# Patient Record
Sex: Male | Born: 1950 | Race: White | Hispanic: No | Marital: Married
Health system: Southern US, Community
[De-identification: ages and names within clinical notes are randomized; demographics above are authoritative.]

---

## 2007-02-25 ENCOUNTER — Ambulatory Visit: Payer: Self-pay | Admitting: Specialist

## 2007-03-09 ENCOUNTER — Ambulatory Visit: Payer: Self-pay | Admitting: Internal Medicine

## 2007-03-23 ENCOUNTER — Ambulatory Visit (HOSPITAL_COMMUNITY): Admission: RE | Admit: 2007-03-23 | Discharge: 2007-03-23 | Payer: Self-pay | Admitting: Neurosurgery

## 2008-03-13 ENCOUNTER — Encounter: Admission: RE | Admit: 2008-03-13 | Discharge: 2008-03-13 | Payer: Self-pay | Admitting: Neurosurgery

## 2009-11-15 ENCOUNTER — Emergency Department: Payer: Self-pay | Admitting: Emergency Medicine

## 2009-11-27 ENCOUNTER — Ambulatory Visit: Payer: Self-pay | Admitting: Internal Medicine

## 2010-05-26 NOTE — Op Note (Signed)
NAME:  Blake Stein, Blake Stein NO.:  [id number]   MEDICAL RECORD NO.:  [id number]          PATIENT TYPE:  OIB   LOCATION:  3599                         FACILITY:  [facility]   PHYSICIAN:  Reinaldo Meeker, M.D. 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   PROCEDURE:  C6-7 anterior cervical diskectomy with bone bank fusion  followed by Mystique anterior cervical plating.   SURGEON:  Reinaldo Meeker, M.D.   PROCEDURE IN DETAIL:  After placement in the supine position in 5 pounds  halter traction the patient's neck was prepped and draped in the usual  sterile fashion.  Localizing x-rays were taken prior to incision to  identify the appropriate level.  Transverse incision was made in the  right anterior neck starting at the midline and headed towards the  medial aspect of the sternocleidomastoid muscle.  The platysma was  incised transversely.  The natural fascial plane between the strap  muscles medially and the sternocleidomastoid laterally was identified  and followed down to the anterior aspect of the cervical spine.  The  longus coli muscles were identified, split in the midline, stripped away  bilaterally with a Science writer.  A self retaining  retractor was placed for exposure and x-rays showed approach to the  appropriate level.  Using the 15 blade the disk at C6-7 was incised.  Using pituitary rongeurs and curettes approximately 90% of the disk  material was removed.  High speed drill was used to widen the  interspace.  The microscope was draped and brought in the field and used  for the remainder of the case.  Using microsection technique the  remainder of the posterior longitudinal ligament was removed.  The  ligament was then incised transversely and the  cut edge was removed with  a Kerrison punch.  Thorough decompression was then carried out.  Inspection towards the right C6-7 foramen yielded a large amount of disk  material with marked compression on the nerve root.  This was removed in  a piecemeal fashion until the nerve root was well visualized and  decompressed.  At this time inspection was carried out in all directions  for any evidence of residual compression and none could be identified.  Large amounts of irrigation were carried out.  Measurements were taken  and an 8 mm bone bank plug was reconstituted.  After irrigating once  more and confirming hemostasis the plug was impacted without difficulty  and fluoroscopy showed it to be in good position.  An appropriate length  Mystique anterior cervical plate was then chosen.  Under fluoroscopic  guidance drill holes were placed followed by tapping and retapping and  placement of 13 mm screws x4.  Final fluoroscopy showed the plate and  screws and plug to all be in good position.  Large amounts of irrigation  were carried out.  Any bleeding controlled with  bipolar coagulation.  The wound was then closed with interrupted Vicryl on the platysma,  inverted 5-0 PDS on the subcuticular layer and Steri-Strips on the skin.  A sterile dressing was then applied on a soft collar.  The patient was  extubated and taken to the recovery room in stable condition.          ______________________________  Reinaldo Meeker, M.D.    ROK/MEDQ  D:  03/23/2007  T:  03/24/2007  Job:  [id number]

## 2010-10-05 LAB — CBC
Platelets: 224
WBC: 5.6

## 2012-03-09 ENCOUNTER — Ambulatory Visit: Payer: Self-pay | Admitting: Physician Assistant

## 2012-03-09 ENCOUNTER — Inpatient Hospital Stay: Payer: Self-pay | Admitting: Surgery

## 2012-03-10 LAB — CBC WITH DIFFERENTIAL/PLATELET
Basophil #: 0.1 10*3/uL (ref 0.0–0.1)
Basophil %: 0.4 %
MCH: 31.7 pg (ref 26.0–34.0)
MCHC: 33.9 g/dL (ref 32.0–36.0)
Monocyte #: 1.2 x10 3/mm — ABNORMAL HIGH (ref 0.2–1.0)
Neutrophil %: 79 %
RDW: 13.3 % (ref 11.5–14.5)

## 2012-03-10 LAB — URINALYSIS, COMPLETE
Bacteria: NONE SEEN
Glucose,UR: NEGATIVE mg/dL (ref 0–75)
Leukocyte Esterase: NEGATIVE
Ph: 8 (ref 4.5–8.0)
Specific Gravity: 1.011 (ref 1.003–1.030)

## 2012-03-10 LAB — COMPREHENSIVE METABOLIC PANEL
Bilirubin,Total: 1.2 mg/dL — ABNORMAL HIGH (ref 0.2–1.0)
Calcium, Total: 8.7 mg/dL (ref 8.5–10.1)
Chloride: 105 mmol/L (ref 98–107)
EGFR (African American): >60
Glucose: 88 mg/dL (ref 65–99)
SGOT(AST): 17 U/L (ref 15–37)
SGPT (ALT): 36 U/L (ref 12–78)
Total Protein: 6.8 g/dL (ref 6.4–8.2)

## 2012-03-10 LAB — LIPID PANEL
Ldl Cholesterol, Calc: 108 mg/dL — ABNORMAL HIGH (ref 0–100)
Triglycerides: 85 mg/dL (ref 0–200)
VLDL Cholesterol, Calc: 17 mg/dL (ref 5–40)

## 2012-03-11 LAB — CBC WITH DIFFERENTIAL/PLATELET
Basophil #: 0 10*3/uL (ref 0.0–0.1)
HGB: 13.4 g/dL (ref 13.0–18.0)
Lymphocyte #: 0.8 10*3/uL — ABNORMAL LOW (ref 1.0–3.6)
Lymphocyte %: 6.6 %
MCH: 32 pg (ref 26.0–34.0)
Neutrophil #: 11.1 10*3/uL — ABNORMAL HIGH (ref 1.4–6.5)
Platelet: 150 10*3/uL (ref 150–440)
RBC: 4.2 10*6/uL — ABNORMAL LOW (ref 4.40–5.90)

## 2012-03-11 LAB — LIPASE, BLOOD: Lipase: 152 U/L (ref 73–393)

## 2012-03-11 LAB — BASIC METABOLIC PANEL
Anion Gap: 5 — ABNORMAL LOW (ref 7–16)
BUN: 11 mg/dL (ref 7–18)
Calcium, Total: 8.7 mg/dL (ref 8.5–10.1)
Co2: 29 mmol/L (ref 21–32)
Creatinine: 0.95 mg/dL (ref 0.60–1.30)
EGFR (African American): >60
Glucose: 115 mg/dL — ABNORMAL HIGH (ref 65–99)
Sodium: 143 mmol/L (ref 136–145)

## 2012-03-14 LAB — PATHOLOGY REPORT

## 2013-03-29 IMAGING — CT CT ABD-PELV W/ CM
1 of 2 series · 14 of 32 positions shown, 18 images · non-contrast
Comparison: none

REASON FOR EXAM: CALL REPORT [id number]  ABD PAIN ELEVATD LIPASE DIABETIC
NOT Metformin
COMMENTS:

[Series 2: soft tissue · axial · 0.70mm/px · z∈[-516,-69]mm · 14 of 163 slices shown, 18 images]
[im 7/163  soft-tissue]
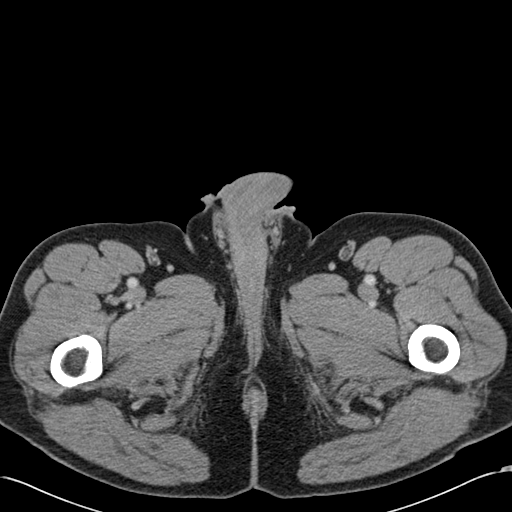
[im 7/163  bone]
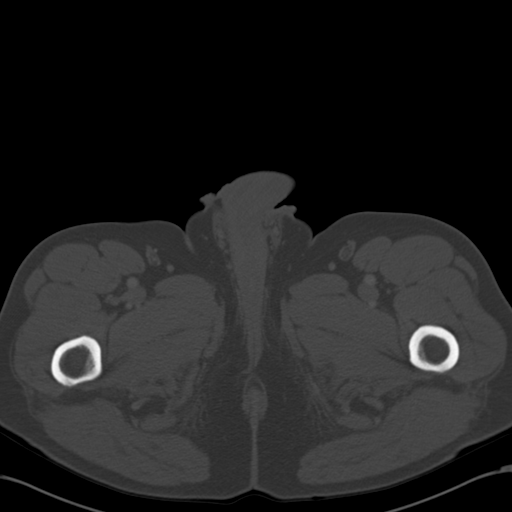
[im 21/163  soft-tissue]
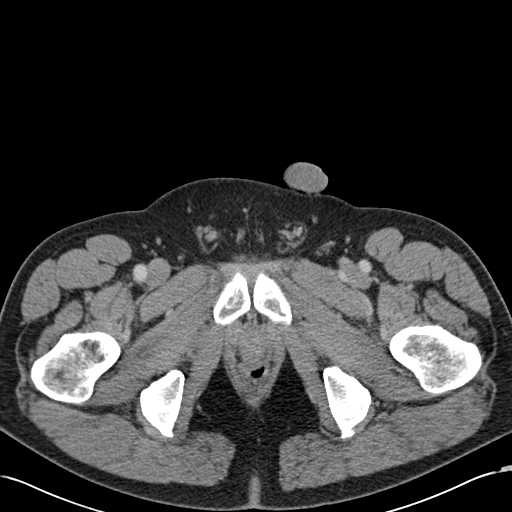
[im 34/163  soft-tissue]
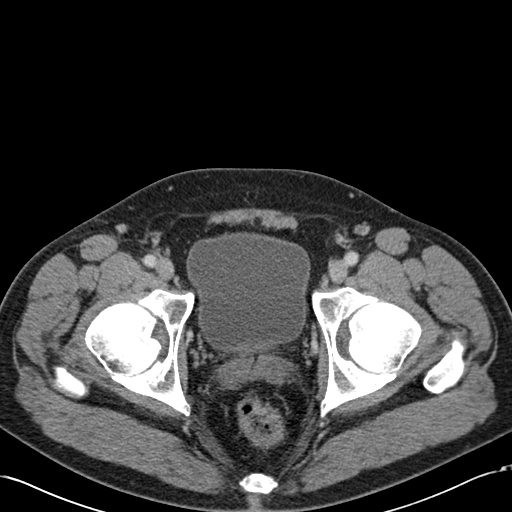
[im 48/163  soft-tissue]
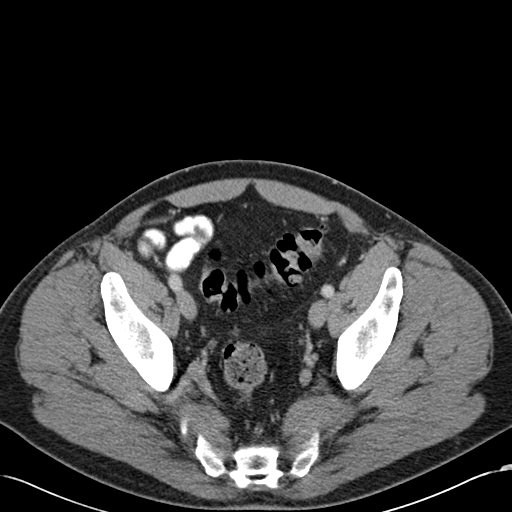
[im 61/163  soft-tissue]
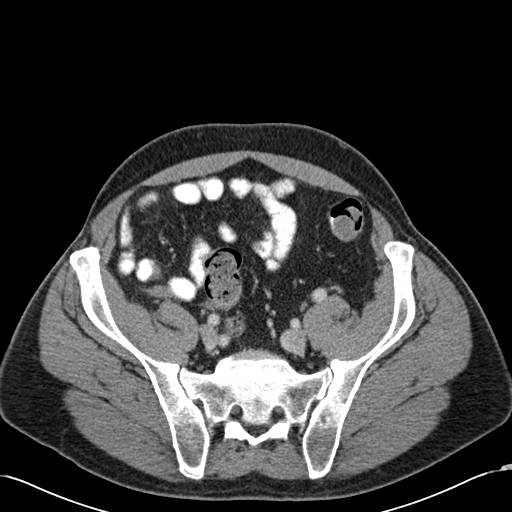
[im 75/163  soft-tissue]
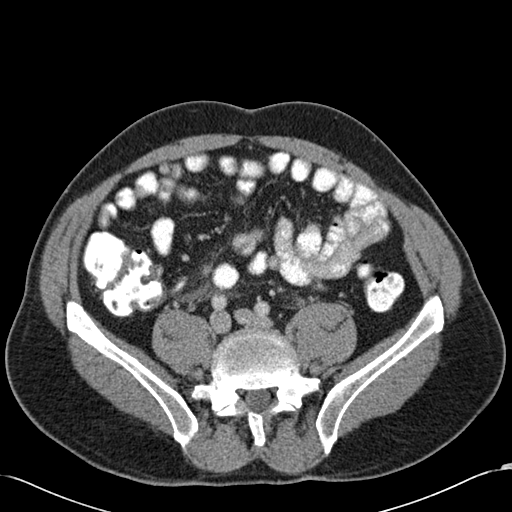
[im 88/163  soft-tissue]
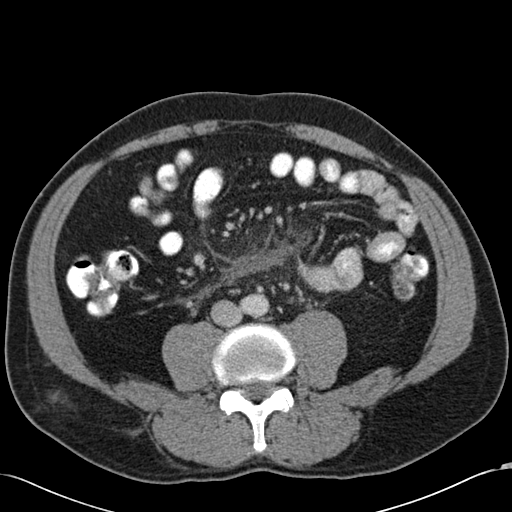
[im 102/163  soft-tissue]
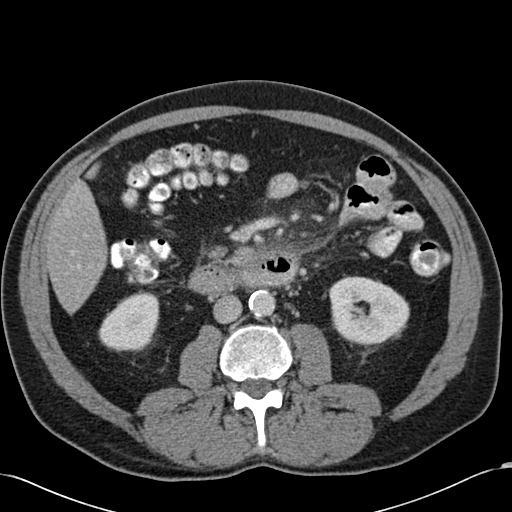
[im 115/163  soft-tissue]
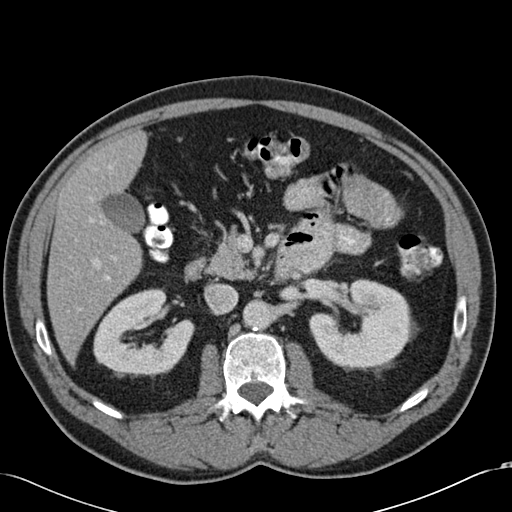
[im 115/163  bone]
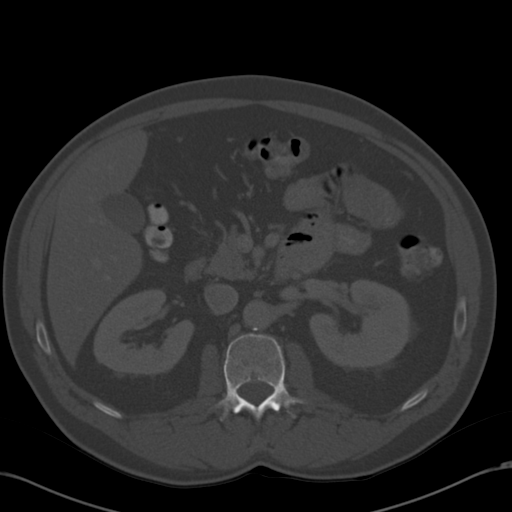
[im 129/163  soft-tissue]
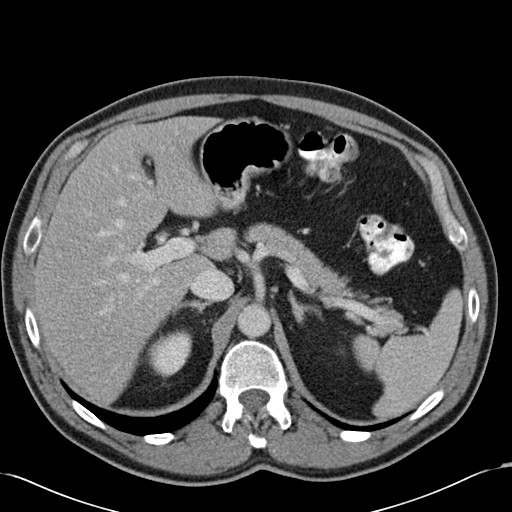
[im 136/163  lung]
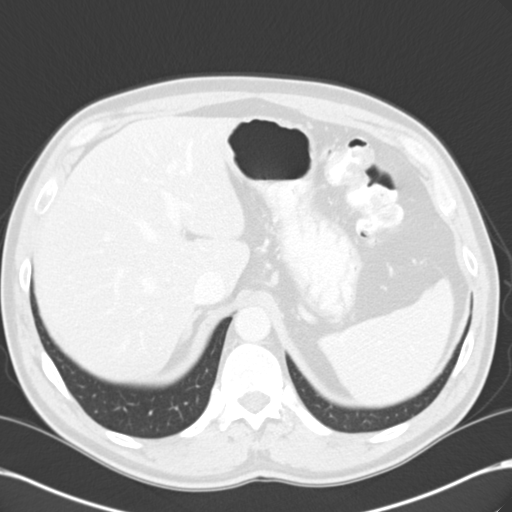
[im 142/163  soft-tissue]
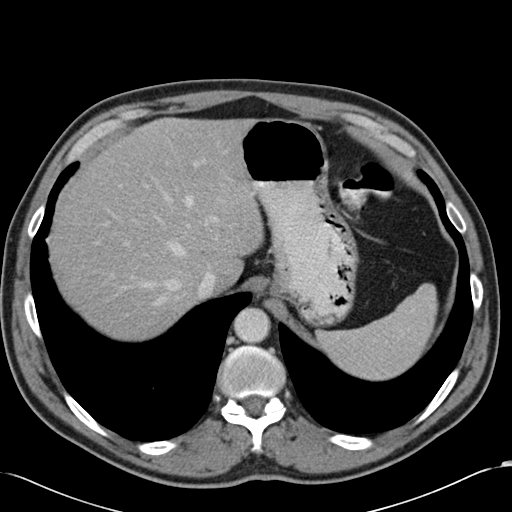
[im 142/163  lung]
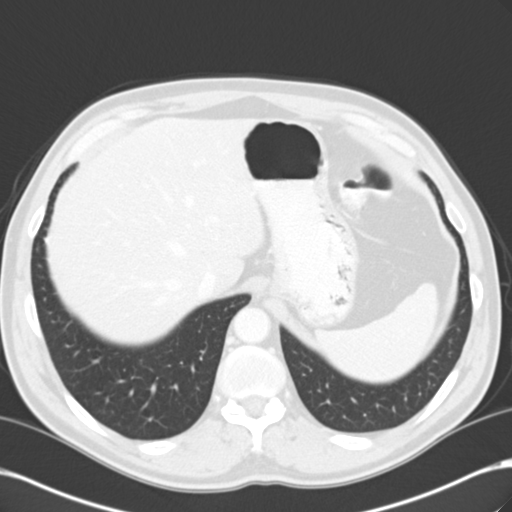
[im 149/163  lung]
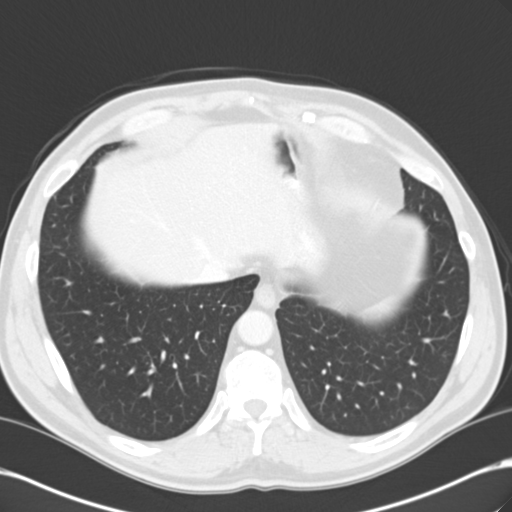
[im 156/163  soft-tissue]
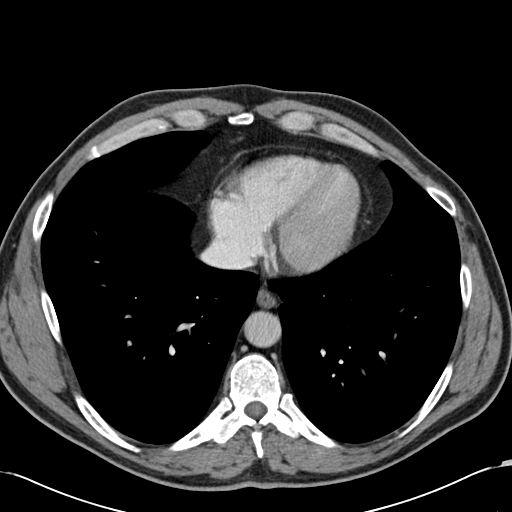
[im 156/163  lung]
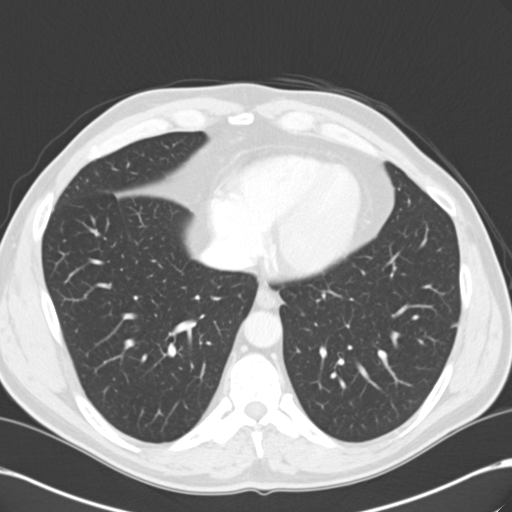

[14 of 32 positions shown; findings below may reference images not displayed]

PROCEDURE:     CT  - CT ABDOMEN / PELVIS  W  - March 09, 2012  [DATE]

RESULT:     Axial CT scanning was performed through the abdomen and pelvis
with reconstructions at 3 mm intervals and slice thicknesses. The patient
received 100 cc of Ksovue-WXX and also received oral contrast material.

There is an abnormal appearance of the soft tissues of the retroperitoneum
inferior to and surrounding the body of the pancreas. There is thickening of
the transverse duodenal wall. There is no discrete pseudocyst or pancreatic
ductal dilation but the pattern is consistent with acute pancreatitis. The
liver exhibits decreased density consistent with fatty infiltration. There
is no intrahepatic ductal dilation. The gallbladder is adequately distended
with no evidence of calcified stones. The spleen is not enlarged. The
partially distended stomach is normal in appearance. The adrenal glands and
kidneys are normal in appearance.

In the right lower quadrant of the abdomen contrast is present within the
proximal portion of the appendix but more distally it appears mildly
edematous. No significant inflammatory change in the periappendiceal fat is
demonstrated. The appendix is visible from images 86 through 107. The small
and large bowel exhibit no evidence of ileus or obstruction or acute
inflammation.

The prostate gland is enlarged and produces an impression upon the urinary
bladder base. There is no inguinal nor significant umbilical hernia. The
lung bases are clear. The lumbar vertebral bodies are preserved in height.
IMPRESSION: 1. There is retroperitoneal inflammatory change in the peripancreatic region
consistent with acute pancreatitis.
2. There is no evidence of bowel obstruction or ileus.
3. There is mild thickening of an lack of contrast in the distal aspect of
the appendix. I cannot exclude focal appendicitis though there is no
significant inflammatory change in the surrounding fat. This will merit
clinical correlation.

A preliminary report was called to Ms. [REDACTED] at the conclusion
of the study.

[REDACTED]

## 2013-06-08 ENCOUNTER — Ambulatory Visit: Payer: Self-pay | Admitting: Gastroenterology

## 2014-05-03 NOTE — Consult Note (Signed)
Brief Consult Note: Diagnosis: Acute pancreatitis, abdominal pain, gout.   Patient was seen by consultant.   Consult note dictated.   Orders entered.   Discussed with Attending MD.   Comments: 64y/o M with PMH of gout and protrate cancer under surveillance admitted for abd pain adn CT abd showing acute pancreatitis  * Abdominal pain- periumbilical and lower abd- CT with infl changes around pancreas consistent with pancreatitis. u/s showing no gall stones, lipase normal- ? any pseudopancreatic cysts etc pain improved since wednesday, cont supportive trt with fluids, pain and nausea meds wbc is elevated, but not high enough, will hold off on ABX at this time. will get a UA though surgery on board, if no improvement- might need diagnostic laproscopy- but likely improve  * Gout- recently took colchicine, no acute flare now  * prostrate caner- under surveillance with annual biopsies and PSA monitorin. cont to follow with urologist.  Electronic Signatures: Enid BaasKalisetti, Sonia Stickels (MD)  (Signed 28-Feb-14 12:47)  Authored: Brief Consult Note   Last Updated: 28-Feb-14 12:47 by Enid BaasKalisetti, Saraann Enneking (MD)

## 2014-05-03 NOTE — Discharge Summary (Signed)
  DATE OF BIRTH:  07-29-1950  DATE OF ADMISSION:  03/09/2012  DATE OF DISCHARGE:  03/11/2012  CHIEF COMPLAINT:  Abdominal pain.   DIAGNOSES:  Acute appendicitis, gout, prostate cancer.   PROCEDURES:  Diagnostic laparoscopy, laparoscopic appendectomy.   HISTORY OF PRESENT ILLNESS AND HOSPITAL COURSE:  This is a patient who was admitted to the hospital with lower abdominal pain. A CT scan had conflicting suggestions of either pancreatitis or acute appendicitis, neither of which were very convincing on the CT scan, which I personally reviewed and discussed with Dr. Michela PitcherEly. However, the patient continued while in observation to have persistent bilateral lower quadrant pain, tenderness with guarding and percussion tenderness, and a mild leukocytosis. For that reason, in the absence of a good diagnosis and a reliable diagnosis, he was taken to the Operating Room, where diagnostic laparoscopy demonstrated what appeared to be a fairly normal appendix, with no other abnormal pathology. The appendix was removed.   Postoperatively, his pain was completely gone, with the exception of new pain at the left lower quadrant 13 mm port insertion site, which would be typical for a patient after an appendectomy performed laparoscopically in this manner. His pain preoperatively was completely gone.   He is tolerating a regular diet, feeling well, and wanted to be discharged. Serial examinations demonstrated a healthy male patient with no further peritoneal signs, and he was discharged in stable condition to restart all of his home medications and to utilize Vicodin as needed for pain. He will follow up in my office in 10 days, or sooner if he has any return of his symptoms.    ____________________________ Adah Salvageichard E. Excell Seltzerooper, MD rec:mr D: 03/11/2012 15:23:00 ET T: 03/11/2012 21:40:55 ET JOB#: [id number]  cc: Adah Salvageichard E. Excell Seltzerooper, MD, <Dictator> Lattie HawICHARD E Raegan Sipp MD ELECTRONICALLY SIGNED 03/12/2012 13:22

## 2014-05-03 NOTE — H&P (Signed)
Subjective/Chief Complaint abd pain   History of Present Illness 36 hours diffuse abd pain, mostly bilat lower quadrants no n/v chills, no fevers, no prior episode also has bilat flank and rt rib pain no emesis   Past History gout prostate cancer denies DM, HTN, heart disease PSH no abd surgeries   Past Medical Health Cancer, Smoking   Past Med/Surgical Hx:  planterfacaitis:   ALLERGIES:  Penicillin: Unknown  Family and Social History:  Family History Non-Contributory   Social History positive  tobacco, positive ETOH, cigars, 3 beers per day, employed   + Tobacco Current (within 1 year)   Place of Living Home   Review of Systems:  Subjective/Chief Complaint hungry   Fever/Chills No   Cough No   Abdominal Pain Yes   Diarrhea No   Constipation No   Nausea/Vomiting No   SOB/DOE No   Chest Pain No   Dysuria No   Tolerating Diet Yes   Physical Exam:  GEN no acute distress   HEENT pink conjunctivae   NECK supple   RESP normal resp effort  clear BS  no use of accessory muscles   CARD regular rate   ABD positive tenderness  positive Flank Tenderness  no hernia  distended  min bilat flank tenderness, max tenderness bilat LQ   LYMPH negative neck   EXTR negative edema   SKIN normal to palpation   PSYCH alert, A+O to time, place, person, good insight   Radiology Results: CT:    27-Feb-14 13:13, CT Abdomen and Pelvis With Contrast  CT Abdomen and Pelvis With Contrast  REASON FOR EXAM:    CALL REPORT [id number]  ABD PAIN ELEVATD LIPASE DIABETIC   NOT Metformin  COMMENTS:       PROCEDURE: CT  - CT ABDOMEN / PELVIS  W  - Mar 09 2012  1:13PM     RESULT: Axial CT scanning was performed through the abdomen and pelvis   with reconstructions at 3 mm intervals and slice thicknesses. The patient   received 100 cc of Isovue-300 and also received oral contrast material.    There is an abnormal appearance of the soft tissues of the   retroperitoneum  inferior to and surrounding the body of the pancreas.   There is thickening of the transverse duodenal wall. There is no discrete   pseudocyst or pancreatic ductal dilation but the pattern is consistent   with acute pancreatitis. The liver exhibits decreased density consistent    with fatty infiltration. There is no intrahepatic ductal dilation. The   gallbladder is adequately distended with no evidence of calcified stones.   The spleen is not enlarged. The partially distended stomach is normal in   appearance. The adrenal glands and kidneys are normal in appearance.    In the right lower quadrant of the abdomen contrast is present within the   proximal portion of the appendix but more distally it appears mildly   edematous. No significant inflammatory change in the periappendiceal fat   is demonstrated. The appendix is visible from images 86 through 107. The   small and large bowel exhibit no evidence of ileus or obstruction or   acute inflammation.     The prostate gland is enlarged and produces an impression upon the   urinary bladder base. There is no inguinal nor significant umbilical   hernia. The lung bases are clear. The lumbar vertebral bodies are     preserved in height.    IMPRESSION:  1. There is retroperitoneal inflammatory change in the peripancreatic   region consistent with acute pancreatitis.  2. There is no evidence of bowel obstruction or ileus.  3. There is mild thickening of an lack of contrast in the distal aspect   of the appendix. I cannot exclude focal appendicitis though there is no   significant inflammatory change in the surrounding fat. This will merit   clinical correlation.     A preliminary report was called to Ms. McLaughlin's office at the   conclusion of the study.     Dictation Site: 1    Verified By: DAVID A. [place], M.D., MD    Assessment/Admission Diagnosis rev'd CT WBC sltly elevated as was lipase, nml LFT mild pancreatitis doubt  appendicitis admit hydrate reexamine   Electronic Signatures: Lattie Haw (MD)  (Signed 27-Feb-14 18:02)  Authored: CHIEF COMPLAINT and HISTORY, PAST MEDICAL/SURGIAL HISTORY, ALLERGIES, FAMILY AND SOCIAL HISTORY, REVIEW OF SYSTEMS, PHYSICAL EXAM, Radiology, ASSESSMENT AND PLAN   Last Updated: 27-Feb-14 18:02 by Lattie Haw (MD)

## 2014-05-03 NOTE — H&P (Signed)
PATIENT NAME:  Blake Stein, Blake Stein MR#:  [id number] DATE OF BIRTH:  Sep 26, 1950  DATE OF ADMISSION:  03/09/2012  CHIEF COMPLAINT: Abdominal pain.   HISTORY OF PRESENT ILLNESS: This is a patient with approximately 36 hours of abdominal pain. He states it is over his whole abdomen but mostly in the lower quadrants and he also has bilateral flank pain and tenderness, epigastric pain and right rib pain. He has had no nausea or vomiting. No fevers but thought he had chills earlier today. He has had normal bowel movements. He has never had an episode like this before.   PAST MEDICAL HISTORY: Denies diabetes, hypertension or cardiac disease but has had prostate cancer and has gout, but is not routinely medicated for the gout. He also has reflux disease. Recently started a new antireflux medication, possibly omeprazole, although it is not on his medication list.  FAMILY HISTORY:  Noncontributory.   SOCIAL HISTORY: The patient is employed. He smokes cigars and drinks approximately 3 beers per day. He was a heavier drinker in his younger age but not recently.   ALLERGIES:  PENICILLIN.   MEDICATIONS:  Mostly over-the-counter medications.  See list.   REVIEW OF SYSTEMS: 10-system review is performed and negative with the exception I mentioned in the HPI.  Also, of note, the patient was [facility] having been seen by a physician extender where a CT scan suggested pancreatitis, but could not rule out appendicitis. He was sent by Dr. Renda RollsWilton Smith to be seen by the surgical hospitalist.   PHYSICAL EXAMINATION: GENERAL:  Healthy, comfortable-appearing Caucasian male patient.  VITAL SIGNS:  Temp of 98.6, pulse of 100, respirations 20, blood pressure 128/80, 99% room air sat. Pain scale of 5. No IV is running at this time.  HEENT:  Shows no scleral icterus.  No palpable neck nodes.  CHEST:  Clear to auscultation.  CARDIAC: Regular rate and rhythm.  ABDOMEN: Showing some guarding mostly in both  lower quadrants. He has some percussion tenderness in both lower quadrants, as well as the epigastrium. There is a small umbilical hernia which is reducible and nontender and nonerythematous. He has mild CVA tenderness bilaterally. No rebound tenderness and no McBurney's point tenderness out of the ordinary in comparison to the left lower quadrant.  EXTREMITIES: Without edema.  NEUROLOGIC:  Grossly intact.  INTEGUMENT: Shows no jaundice.  Laboratory values were sent over from [facility] and are not available in the computer. He had a mildly elevated white blood cell count, normal liver function tests and a lipase that was slightly elevated as well.  CT scan is personally reviewed. There appears to be some mild pancreatitis. There is a mention of contrast failing to fill the tip of the appendix but there is no surrounding stranding in the periappendiceal area.   ASSESSMENT AND PLAN: This is a patient with an unusual presentation and physical examination findings. He is fairly tender diffusely in his abdomen without localization in the right lower quadrant. I doubt that the CT findings are real to suggest appendicitis, but the patient will be admitted to the hospital and hydrated and re-examined. The CT scan has been personally reviewed and I believe the patient likely has pancreatitis of unclear origin. I will get an ultrasound, although his gallbladder does not appear abnormal on his CT scan and his liver function tests are normal. The etiology of this is unclear to me. I will ask [facility] physicians to see him as well.   ____________________________  Courtnee Myer E. Excell Seltzer, MD rec:ce D: 03/09/2012 18:07:24 ET T: 03/09/2012 18:23:31 ET JOB#: [id number]  cc: Adah Salvage. Excell Seltzer, MD, <Dictator> Lattie Haw MD ELECTRONICALLY SIGNED 03/10/2012 8:07

## 2014-05-03 NOTE — Op Note (Signed)
PATIENT NAME:  Blake Stein, Blake Stein MR#:  [id number] DATE OF BIRTH:  January 15, 1950  DATE OF PROCEDURE:  03/10/2012  PREOPERATIVE DIAGNOSIS: Abdominal pain.   POSTOPERATIVE DIAGNOSIS: Appendicitis.   PROCEDURES:  1.  Diagnostic laparoscopy. 2.  Laparoscopic appendectomy.   SURGEON: Dionne Miloichard Laurelyn Terrero, M.D.   ANESTHESIA: General with endotracheal tube.   INDICATIONS:  This is a patient with a confusing diagnostic dilemma with possible pancreatitis versus possible appendicitis with lower quadrant peritoneal signs and leukocytosis.  Preoperatively we discussed rationale for surgery, the options of observation and continued examination and the risks of bleeding, infection, negative laparoscopy, conversion to an open procedure diagnosis. He and his wife understood and agreed to proceed. This was all reviewed for them.   FINDINGS: Fairly normal appearing appendix, slightly indurated at the tip.  No sign of small bowel inflammation. No sign of diverticulitis. No edema in the gallbladder fossa or peri duodenal area or in the area of the stomach. No free fluid. Normal-appearing gallbladder, normal-appearing liver. No peritoneal implants and no sign of diffuse peritonitis to explain his physical exam findings.  The appendix was removed.  DESCRIPTION OF PROCEDURE: The patient was induced through general anesthesia. He was given IV antibiotics, VTE prophylaxis was in place. Foley catheter was placed. He was then prepped and draped in a sterile fashion. Marcaine was infiltrated in the skin and subcutaneous tissues around the periumbilical area. Incision was made. Veress needle was placed. Pneumoperitoneum was obtained and a 5 mm trocar port was placed. The abdominal cavity was explored and under direct vision, a 12 mm left lateral port was placed and a 5 mm suprapubic port was placed. Exploration failed to identify any area of induration or fluid to guide us into an area of likely etiology for his abdominal pain; however,  elevation of the appendix with the aid of sharp dissection to take down his lateral reflection was performed. The appendix was slightly indurated, but fairly normal-appearing.  Appendectomy was performed by dividing the mesoappendix between 2 loads of vascular load Endo GIAs and then the base of the appendix was handled with the standard load Endo GIA. The specimen was passed out through the lateral port site with the aid of an Endo Catch bag. The port site was replaced and further exploration was performed. The patient was placed in steep Trendelenburg position to look into the pelvis. There was no sign of induration or active diverticulitis.  The camera was placed in multiple ports and the patient was positioned both head up and the head down and right to left lateral in order to try to identify any other etiology. No other abnormalities were identified.   With the lack of any additional findings, the left lateral port site was closed with simple 0 Vicryl Endo Close sutures. The area was again checked for hemostasis and found to be adequate. The pneumoperitoneum was released. All ports were removed. 4-0 subcuticular Monocryl was used at all skin edges. Steri-Strips, Mastisol and sterile dressings were placed.   The patient tolerated the procedure well. There were no complications. He was taken to the recovery room in stable condition to be admitted for continued care.     ____________________________ Adah Salvageichard E. Excell Seltzerooper, MD rec:ct D: 03/10/2012 15:45:51 ET T: 03/11/2012 10:11:40 ET JOB#: [id number]  cc: Adah Salvageichard E. Excell Seltzerooper, MD, <Dictator> Lattie HawICHARD E Yurem Viner MD ELECTRONICALLY SIGNED 03/11/2012 18:39

## 2014-05-03 NOTE — Consult Note (Signed)
PATIENT NAME:  Blake Stein, Blake Stein MR#:  [id number] DATE OF BIRTH:  11/13/1950  DATE OF CONSULTATION:  03/10/2012  ADMITTING PHYSICIAN:  Dr. Burt Knack.  CONSULTING PHYSICIAN:  Gladstone Lighter, MD  PRIMARY CARE PHYSICIAN:  Dr. Ola Spurr.  CONSULTING PHYSICIAN:  Gladstone Lighter, MD.   REASON FOR CONSULTATION:  Pancreatitis.   HISTORY OF PRESENT ILLNESS:  The patient is a 64 year old Caucasian male with no significant past medical history other than gout and prostatic cancer under surveillance who was admitted by surgical team secondary to acute onset of diffuse abdominal pain. The patient says he never had pancreatitis before. No history of any gallstones but does drink about 3 beers every night. He woke up Wednesday morning with sudden onset of diffuse abdominal pain, not associated with any nausea, vomiting, or diarrhea. He did develop some nausea secondary to morphine but not from the pain on presentation. His blood work done did not show any acute abnormalities. His lipase was within normal limits but a CT of the abdomen and pelvis did show some changes around the pancreas indicated of pancreatitis. He is currently n.p.o., receiving IV fluids and is on Cardura management.   PAST MEDICAL HISTORY: 1.  Gout. 2.  Prostrate cancer under surveillance.    PAST SURGICAL HISTORY:  C-spine surgery.   ALLERGIES TO MEDICATIONS:  PENICILLIN ALLERGY.   HOME MEDICATIONS:  1.  He takes Coltrazine as needed, 0.6 mg for gout flare-up.  2.  Fish oil oral capsule daily. 3.  Glucosamine/chondroitin sulfate 1 tablet p.o. daily. 4.  Multivitamin 1 tablet daily.   SOCIAL HISTORY:  Lives at home with his wife. No smoking. Alcohol:  Drinks about 3 beers every day.   FAMILY HISTORY:  Father died young secondary to lung cancer and mom is still living and is a diabetic.   REVIEW OF SYSTEMS:  CONSTITUTIONAL:  No fever, fatigue, or weakness.  EYES:  No blurred vision, double vision, glaucoma or cataracts.  ENT:   No tinnitus, ear pain, epistaxis or hearing loss.  RESPIRATORY:  No cough, wheeze, hemoptysis, or COPD.  CARDIOVASCULAR:  No chest pain, orthopnea, edema, arrhythmia, palpitations, or syncope.  GASTROINTESTINAL:  Positive for nausea. No vomiting or diarrhea. Positive for abdominal pain. No hematemesis, or melena.  GENITOURINARY:  No dysuria, hematuria, renal calculus, frequency, or incontinence.  ENDOCRINE:  No polyuria, nocturia, thyroid problems, heat or cold intolerance.  HEMATOLOGY:  No anemia, easy bruising or bleeding.  SKIN:  No acne, rash, or lesions.  MUSCULOSKELETAL:  No neck, back, shoulder pain, arthritis but has gout.   NEUROLOGIC:  No numbness, weakness, CVA, transient ischemic attack, or seizures.  PSYCHOLOGIC:  No anxiety, insomnia, depression actually.  MUSCULOSKELETAL:  No arthritis but positive for gout.   PHYSICAL EXAMINATION: VITAL SIGNS:  Temperature 98.7 degrees Fahrenheit, pulse 89, respirations 18, blood pressure 115/74, pulse ox 97% on room air.  GENERAL:   A well-built, well nourished male lying in bed, not in any acute distress.  HEENT:  Normocephalic, atraumatic. Pupils equal, round, reacting to light. Anicteric sclerae. Extraocular movements intact. Oropharynx clear without erythema, mass or exudate, dry mucous membranes.  NECK:  Supple. No thyromegaly, JVD, or carotid bruits. No lymphadenopathy.  LUNGS:  Clear to auscultation. No wheeze or crackles. No use of accessory muscles for breathing.  CARDIOVASCULAR:  S1, S2, regular rate and rhythm. No murmurs, rubs, or gallops.  ABDOMEN:  Soft, mild discomfort with voluntary guarding around the periumbilical region and infraumbilical region. Normal bowel sounds. No hepatosplenomegaly is present.  EXTREMITIES:  No pedal edema. No clubbing or cyanosis, 2+ dorsalis pedis pulses palpable bilaterally.  SKIN:  No acne, rash, or lesions.  MUSCULOSKELETAL:  No joint tenderness or swelling.  LYMPHATICS:  No cervical  lymphadenopathy.  NEUROLOGIC:  Cranial nerves intact. No focal, motor or sensory deficits.  PSYCHOLOGICAL:  The patient is awake, alert, oriented x 3.   LABORATORY, DIAGNOSTIC AND RADIOLOGIC DATA:  WBC 12.8, hemoglobin 14.5, hematocrit 42.9, platelet count 159, sodium 140, potassium 4.0, chloride 105, bicarb 29, BUN 12, creatinine 1.1, glucose 88 and calcium of 8.7.   ALT 36, AST 17, alk phos 37, total bilirubin 1.2, albumin 3.5 and lipase of 302.  CT of the abdomen and pelvis with contrast showing retroperitoneal inflammatory changes in peripancreatic region consistent with acute pancreatitis. No evidence of bowel obstruction or ileus. Cannot exclude focal appendicitis though there is no inflammatory changes in the surrounding fat. The liver showed decreased density consistent with fatty infiltration. No intrahepatic duct dilatation. The gallbladder is adequately distended with no evidence of calcified stones. Ultrasound of the abdomen shows gallbladder normal. The common bile duct is difficult to be definitely identified; however, no dilatation, hepatic steatosis is present and the pancreas is obscured by bowel gas.  RECOMMENDATION:  A 64 year old male with a history of gout and prostate cancer under surveillance was admitted for abdominal pain to the surgical service and medical consult requested for acute pancreatitis.  1.  Acute onset of abdominal pain, likely pancreatitis based on the CT changes. No gallstones identified on CT abdomen or ultrasound. Lipase is normal. Not sure if there is anything else going on with the pancreas, pseudopancreatic cyst, et. Ronney Asters. Focal appendicitis was not ruled out. The patient does have some pain in the right lower quadrant but which is generalized. The pain has slightly improve, so we will continue supportive treatment with fluids, pain and nausea medications. The white blood cell count is only slightly elevated so we will hold off on any antibiotics as the  patient is afebrile, too. We will get urinalysis at this time. Surgery is on board and if no improvement per Dr. Burt Knack they might try a diagnostic laparoscopy. Lipid profile has been ordered to rule out triglyceride as the cause for pancreatitis. The patient does drink about 3 beers every day and continue to monitor lipase. 2.  Gout. Recently took Coltrazine, usually does not cause pancreatitis, no acute flare, continue to monitor.  3.  Prostate cancer under surveillance with annual biopsies and PSA monitoring. Continue to follow with outpatient urologist.   CODE STATUS:  FULL CODE.   TIME SPENT ON CONSULTATION:  50 minutes.   ____________________________ Gladstone Lighter, MD [id number] D: 03/10/2012 13:44:48 ET T: 03/10/2012 18:07:47 ET JOB#: [id number]  cc: Gladstone Lighter, MD, <Dictator> Cheral Marker. Ola Spurr, MD Gladstone Lighter MD ELECTRONICALLY SIGNED 03/15/2012 13:35
